# Patient Record
Sex: Male | Born: 1975 | Hispanic: Yes | Marital: Married | Smoking: Never smoker
Health system: Southern US, Community
[De-identification: ages and names within clinical notes are randomized; demographics above are authoritative.]

---

## 2012-03-25 ENCOUNTER — Emergency Department (HOSPITAL_COMMUNITY): Payer: Self-pay

## 2012-03-25 ENCOUNTER — Emergency Department (HOSPITAL_COMMUNITY)
Admission: EM | Admit: 2012-03-25 | Discharge: 2012-03-25 | Disposition: A | Discharge status: Home / Self Care | Payer: Self-pay | Attending: Emergency Medicine | Admitting: Emergency Medicine

## 2012-03-25 ENCOUNTER — Encounter (HOSPITAL_COMMUNITY): Payer: Self-pay | Admitting: Emergency Medicine

## 2012-03-25 DIAGNOSIS — S92403B Displaced unspecified fracture of unspecified great toe, initial encounter for open fracture: Secondary | ICD-10-CM

## 2012-03-25 DIAGNOSIS — Y990 Civilian activity done for income or pay: Secondary | ICD-10-CM | POA: Insufficient documentation

## 2012-03-25 DIAGNOSIS — S97119A Crushing injury of unspecified great toe, initial encounter: Secondary | ICD-10-CM

## 2012-03-25 DIAGNOSIS — S91113A Laceration without foreign body of unspecified great toe without damage to nail, initial encounter: Secondary | ICD-10-CM

## 2012-03-25 DIAGNOSIS — S90129A Contusion of unspecified lesser toe(s) without damage to nail, initial encounter: Secondary | ICD-10-CM | POA: Insufficient documentation

## 2012-03-25 DIAGNOSIS — S91109A Unspecified open wound of unspecified toe(s) without damage to nail, initial encounter: Secondary | ICD-10-CM | POA: Insufficient documentation

## 2012-03-25 DIAGNOSIS — S91209A Unspecified open wound of unspecified toe(s) with damage to nail, initial encounter: Secondary | ICD-10-CM

## 2012-03-25 DIAGNOSIS — M79609 Pain in unspecified limb: Secondary | ICD-10-CM | POA: Insufficient documentation

## 2012-03-25 DIAGNOSIS — W208XXA Other cause of strike by thrown, projected or falling object, initial encounter: Secondary | ICD-10-CM | POA: Insufficient documentation

## 2012-03-25 DIAGNOSIS — R209 Unspecified disturbances of skin sensation: Secondary | ICD-10-CM | POA: Insufficient documentation

## 2012-03-25 MED ORDER — CEFAZOLIN SODIUM 1-5 GM-% IV SOLN
1.0000 g | Freq: Once | INTRAVENOUS | Status: AC
  Administered 2012-03-25: 1 g via INTRAVENOUS
  Filled 2012-03-25: qty 50

## 2012-03-25 MED ORDER — ONDANSETRON HCL 4 MG/2ML IJ SOLN
4.0000 mg | Freq: Once | INTRAMUSCULAR | Status: AC
  Administered 2012-03-25: 4 mg via INTRAVENOUS
  Filled 2012-03-25: qty 2

## 2012-03-25 MED ORDER — HYDROCODONE-ACETAMINOPHEN 5-500 MG PO TABS
1.0000 | ORAL_TABLET | Freq: Four times a day (QID) | ORAL | Status: AC | PRN
Start: 2012-03-25 — End: 2012-04-04

## 2012-03-25 MED ORDER — HYDROMORPHONE HCL PF 1 MG/ML IJ SOLN
1.0000 mg | Freq: Once | INTRAMUSCULAR | Status: AC
  Administered 2012-03-25: 1 mg via INTRAVENOUS
  Filled 2012-03-25: qty 1

## 2012-03-25 MED ORDER — CEPHALEXIN 500 MG PO CAPS: 500.0000 mg | ORAL_CAPSULE | Freq: Four times a day (QID) | ORAL | Status: AC

## 2012-03-25 MED ORDER — SODIUM CHLORIDE 0.9 % IV BOLUS (SEPSIS)
1000.0000 mL | Freq: Once | INTRAVENOUS | Status: AC
  Administered 2012-03-25: 1000 mL via INTRAVENOUS

## 2012-03-25 MED ORDER — TETANUS-DIPHTH-ACELL PERTUSSIS 5-2.5-18.5 LF-MCG/0.5 IM SUSP
0.5000 mL | Freq: Once | INTRAMUSCULAR | Status: AC
  Administered 2012-03-25: 0.5 mL via INTRAMUSCULAR
  Filled 2012-03-25: qty 0.5

## 2012-03-25 NOTE — ED Provider Notes (Signed)
History    This chart was scribed for Suzi Roots, MD, MD by Smitty Pluck. The patient was seen in room TR11C and the patient's care was started at 11:50AM.   CSN: [id number]  Arrival date & time 03/25/12  1128   None     Chief Complaint  Patient presents with  . Foot Injury    (Consider location/radiation/quality/duration/timing/severity/associated sxs/prior treatment) HPI Dustin Gallagher is a 36 y.o. male who presents to the Emergency Department complaining of moderate right great toe injury onset today while at work. Pt denies having UTD tetanus shot. Pt states was at work, heavy object falling onto right great toe. Constant, dull pain worse w movement/palpation. +numbness. Denies other injury. Skin lac to toe.     History reviewed. No pertinent past medical history.  History reviewed. No pertinent past surgical history.  History reviewed. No pertinent family history.  History  Substance Use Topics  . Smoking status: Never Smoker   . Smokeless tobacco: Not on file  . Alcohol Use: Yes      Review of Systems  Constitutional: Negative for fever.  Gastrointestinal: Negative for vomiting.  Skin: Positive for wound.  Neurological: Positive for numbness.    Allergies  Review of patient's allergies indicates no known allergies.  Home Medications   Current Outpatient Rx  Name Route Sig Dispense Refill  . OVER THE COUNTER MEDICATION Nasal Place 1 spray into the nose 2 (two) times daily as needed. For nasal drip      BP 117/76  Pulse 91  Temp 98.3 F (36.8 C) (Oral)  Resp 18  SpO2 97%  Physical Exam  Nursing note and vitals reviewed. Constitutional: He is oriented to person, place, and time. He appears well-developed and well-nourished. No distress.  HENT:  Head: Atraumatic.  Eyes: Conjunctivae are normal.  Neck: No tracheal deviation present.  Cardiovascular: Normal rate.   Pulmonary/Chest: Effort normal. No accessory muscle usage. No respiratory  distress.  Musculoskeletal:       Contusion/crush injury to right great toe. Nail bed disrupted, nail almost completely avulsed/subun hematoma spont draining. +cap refill distally. Open wound/laceration to great toe along medial and lateral aspect. Unable to fully flex/move great toe due to pain and swelling. Dp/pt 2+.   Neurological: He is alert and oriented to person, place, and time.  Skin: Skin is warm and dry.  Psychiatric: He has a normal mood and affect.    ED Course  Procedures (including critical care time) DIAGNOSTIC STUDIES: Oxygen Saturation is 97% on room air, normal by my interpretation.    COORDINATION OF CARE: 12:00PM EDP ordered medication: dilaudid 1 mg, boostrix 0.5 ml, Zofran 4 mg, 0.9% NaCl bolus 1000 ml, ancef 1 g/ 50 ml   Dg Toe Great Right  03/25/2012  *RADIOLOGY REPORT*  Clinical Data: Crush injury with pain and laceration.  RIGHT GREAT TOE  Comparison: None.  Findings: There is a mildly displaced fracture of the first distal phalanx, centered at the midportion.  Fracture line appears to extend proximally to the interphalangeal joint.  Overlying soft tissue injury is seen predominantly along the dorsal surface.  No dislocation.  IMPRESSION: Open first distal phalangeal fracture.  Original Report Authenticated By: Reyes Ivan, M.D.       MDM  I personally performed the services described in this documentation, which was scribed in my presence. The recorded information has been reviewed and considered. No att. providers found  Iv ns. Dilaudid 1 mg iv. zofran iv. Elevate, coldpack. Tetanus  im. Ancef iv.   Xrays. Ancef iv.   LACERATION REPAIR Performed by: Suzi Roots Authorized by: Suzi Roots Consent: Verbal consent obtained. Risks and benefits: risks, benefits and alternatives were discussed Consent given by: patient Patient identity confirmed: provided demographic data Prepped and Draped in normal sterile fashion Wound explored  Laceration  Location: right great toe  Laceration Length: 4 cm  No Foreign Bodies seen or palpated  Anesthesia: digit block  Local anesthetic: lidocaine 2% wo epinephrine  Anesthetic total: 6 ml  Irrigation method: syringe Amount of cleaning: extensive.  Skin closure: layered. Subcut approx w 4-0 vicryl 4 sutures, skin 4-0 prolene 10, loosely approximated skin edges  Number of sutures: 14  Technique: simple interrupted, layered closure.  Patient tolerance: Patient tolerated the procedure well with no immediate complications.   Discussed need for elevation, cold, avoid walking on as risk tearing sutures.  Recheck post suture, good cap refill distally.    Sterile dressing. Cast shoe.    Suzi Roots, MD 03/25/12 [phone]

## 2012-03-25 NOTE — Progress Notes (Signed)
Orthopedic Tech Progress Note Patient Details:  Dustin Gallagher November 03, 1975 [id number]  Ortho Devices Type of Ortho Device: Crutches Ortho Device/Splint Interventions: Application   Ercil Cassis T 03/25/2012, 2:47 PM

## 2012-03-25 NOTE — ED Notes (Signed)
Pt smashed right big toe in machine at work; laceration seen at nail bed; bleeding controlled

## 2012-03-25 NOTE — ED Notes (Signed)
Pt reports having a car lift getting dropped on his foot.  Laceration noted under nail bed.  Bleeding controlled.  Sensation present but weak.  Pt is unable to move toe.

## 2012-03-25 NOTE — Progress Notes (Signed)
Orthopedic Tech Progress Note Patient Details:  Dustin Gallagher 07/15/1976 [id number]  Ortho Devices Type of Ortho Device: Postop boot Ortho Device/Splint Location: right foot Ortho Device/Splint Interventions: Application   Dustin Gallagher 03/25/2012, 2:50 PM

## 2012-07-20 ENCOUNTER — Ambulatory Visit: Payer: Self-pay | Admitting: Family Medicine

## 2012-07-20 VITALS — BP 110/74 | HR 78 | Temp 98.7°F | Resp 20 | Ht 66.0 in | Wt 187.8 lb

## 2012-07-20 DIAGNOSIS — T485X5A Adverse effect of other anti-common-cold drugs, initial encounter: Secondary | ICD-10-CM

## 2012-07-20 DIAGNOSIS — J310 Chronic rhinitis: Secondary | ICD-10-CM

## 2012-07-20 MED ORDER — FLUTICASONE PROPIONATE 50 MCG/ACT NA SUSP: NASAL | Status: DC

## 2012-07-20 MED ORDER — PREDNISONE 20 MG PO TABS: ORAL_TABLET | ORAL | Status: DC

## 2012-07-20 NOTE — Patient Instructions (Signed)
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 This is a rebound problem which causes too much swelling of the tissues in the nose whenever the nose spray as getting out of the system. It is very important to get off of the over-the-counter nose spray completely.  Sudafed OTC can be purchased by asking the pharmacist for it. He needs the type that has to be signed for at the pharmacy counter.  Flonase 2 sprays each nostril twice daily for one week, then once daily  Take the prednisone as directed  Return if not improving over the next week or 2, or if worse at any time.

## 2012-07-20 NOTE — Progress Notes (Signed)
Subjective: Patient has been having nasal congestion for the past 6 months. He has been using oxymetazoline nasal spray about 3 times a day. It helps, but then he is more congested. He works at a [employer]. He does not smoke. The patient does not speaking this, but his friend is interpreting for him.  Objective: TMs normal. Nose has a lot of swelling of the turbinates. Throat clear. Neck supple without significant nodes.  Assessment: Rhinitis medicamentosa  Plan: Discontinue the use of the oxymetazoline. Flonase twice a day for one week, then daily OTC Sudafed Prednisone taper Return if not improving

## 2013-11-09 ENCOUNTER — Ambulatory Visit: Payer: Self-pay | Admitting: Physician Assistant

## 2013-11-09 VITALS — BP 120/82 | HR 67 | Temp 97.7°F | Resp 16 | Ht 67.0 in | Wt 192.0 lb

## 2013-11-09 DIAGNOSIS — J310 Chronic rhinitis: Secondary | ICD-10-CM

## 2013-11-09 DIAGNOSIS — T485X5A Adverse effect of other anti-common-cold drugs, initial encounter: Secondary | ICD-10-CM

## 2013-11-09 DIAGNOSIS — Z9109 Other allergy status, other than to drugs and biological substances: Secondary | ICD-10-CM

## 2013-11-09 DIAGNOSIS — J329 Chronic sinusitis, unspecified: Secondary | ICD-10-CM

## 2013-11-09 MED ORDER — PREDNISONE 10 MG PO TABS: ORAL_TABLET | ORAL | Status: AC

## 2013-11-09 MED ORDER — FLUTICASONE PROPIONATE 50 MCG/ACT NA SUSP: NASAL | Status: AC

## 2013-11-09 MED ORDER — AMOXICILLIN 875 MG PO TABS: 1750.0000 mg | ORAL_TABLET | Freq: Two times a day (BID) | ORAL | Status: AC

## 2013-11-09 MED ORDER — GUAIFENESIN ER 1200 MG PO TB12: 1.0000 | ORAL_TABLET | Freq: Two times a day (BID) | ORAL | Status: AC

## 2013-11-09 NOTE — Patient Instructions (Signed)
NO MORE AFRIN!!! You have a sinus infection as a result of your allergies.  You will be on the Flonase to help with your daily allergies.

## 2013-11-09 NOTE — Progress Notes (Signed)
   Subjective:    Patient ID: Dustin Gallagher, male    DOB: 1976/01/21, 38 y.o.   MRN: [id number]  HPI Pt presents to clinic with significant sinus pressure and congestion.  He has yellow sinus drainage and has for about 2 months.  He has chronic sinus congestion especially at night.  He was given Flonase in the past that helped with his nightly congestion but he ran out of it.  He started using OTC generic afrin about 2 months ago but it does not seem to be helping at all.    OTC med - afrin for about 2 months Works in a dusty environment  Review of Systems  HENT: Positive for congestion and rhinorrhea (yellow). Negative for postnasal drip.   Respiratory: Negative for cough.   Neurological: Negative for dizziness and headaches.       Objective:   Physical Exam  Vitals reviewed. Constitutional: He is oriented to person, place, and time. He appears well-developed and well-nourished.  HENT:  Head: Normocephalic and atraumatic.  Right Ear: Hearing, tympanic membrane, external ear and ear canal normal.  Left Ear: Hearing, tympanic membrane, external ear and ear canal normal.  Nose: Mucosal edema (swollen) present. No rhinorrhea.  Mouth/Throat: Uvula is midline, oropharynx is clear and moist and mucous membranes are normal.  Eyes: Conjunctivae are normal.  Neck: Normal range of motion.  Cardiovascular: Normal rate, regular rhythm and normal heart sounds.   No murmur heard. Pulmonary/Chest: Effort normal and breath sounds normal.  Neurological: He is alert and oriented to person, place, and time.  Skin: Skin is warm and dry.  Psychiatric: He has a normal mood and affect. His behavior is normal. Judgment and thought content normal.       Assessment & Plan:  Environmental allergies - Plan: fluticasone (FLONASE) 50 MCG/ACT nasal spray  Rhinitis medicamentosa - Plan: fluticasone (FLONASE) 50 MCG/ACT nasal spray, predniSONE (DELTASONE) 10 MG tablet  Sinusitis - Plan: amoxicillin  (AMOXIL) 875 MG tablet, Guaifenesin (MUCINEX MAXIMUM STRENGTH) 1200 MG TB12  D/w the patient the importance of not using Afrin for more than 3 days at a time due to rebound congestion.  He has environmental allergies and needs daily treatment with intranasal steroid to help his nasal congestion.    Benny LennertSarah Chandell Attridge PA-C 11/09/2013 9:09 AM

## 2013-11-30 IMAGING — CR DG TOE GREAT 2+V*R*
3 series · 3 of 3 positions shown · non-contrast
Comparison: None.

CLINICAL DATA: Crush injury with pain and laceration.

RIGHT GREAT TOE

[x toes ap left]
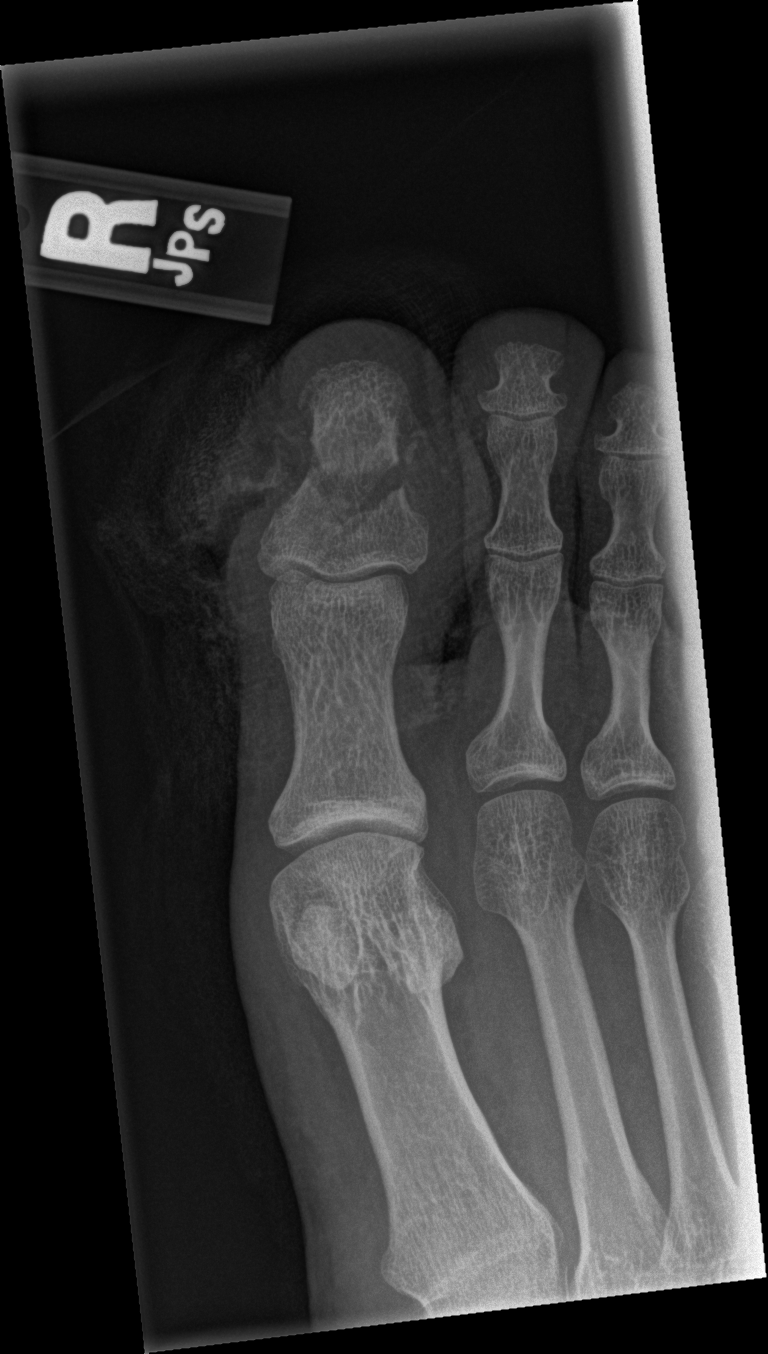

[x toes obl left]
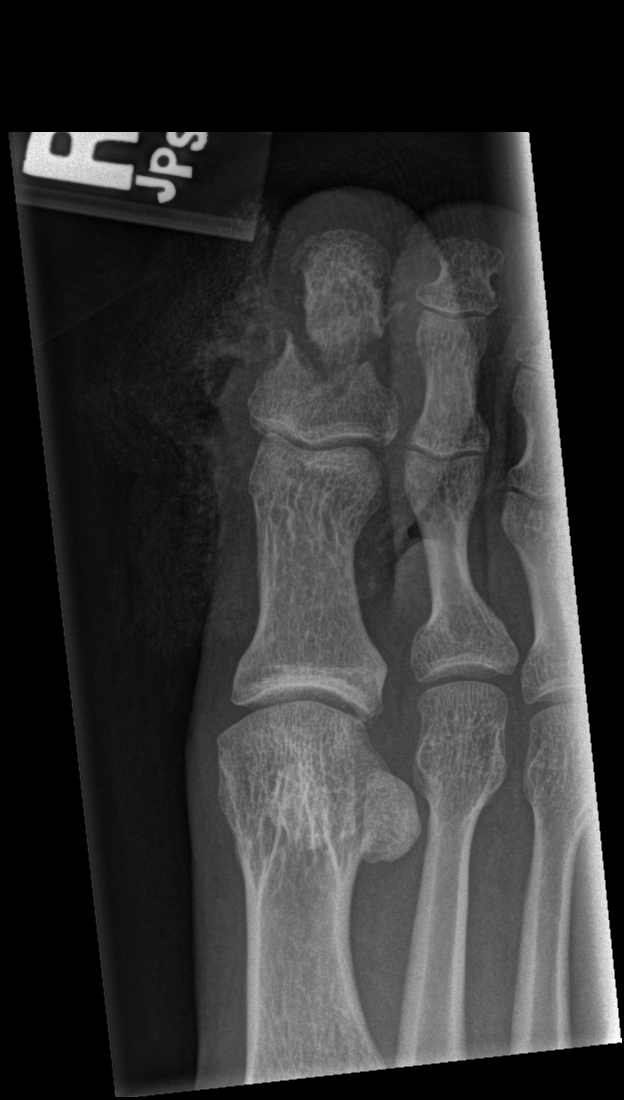

[x toes lat left]
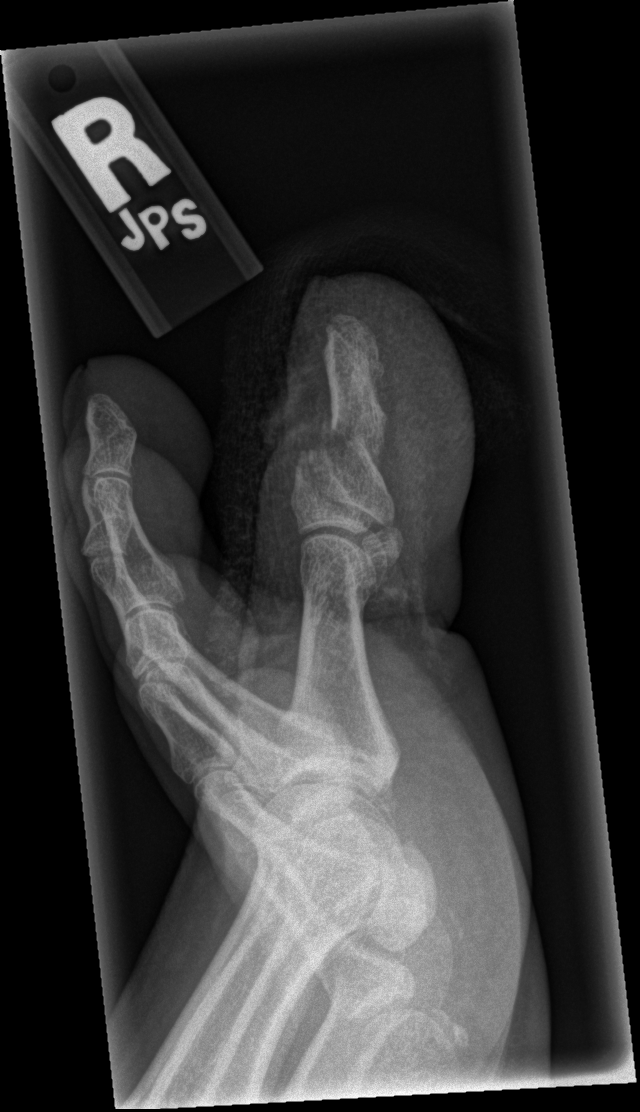

[3 of 3 positions shown; findings below may reference images not displayed]

FINDINGS: There is a mildly displaced fracture of the first distal
phalanx, centered at the midportion.  Fracture line appears to
extend proximally to the interphalangeal joint.  Overlying soft
tissue injury is seen predominantly along the dorsal surface.  No
dislocation.
IMPRESSION: Open first distal phalangeal fracture.

## 2018-03-11 ENCOUNTER — Emergency Department (HOSPITAL_COMMUNITY)
Admission: EM | Admit: 2018-03-11 | Discharge: 2018-03-12 | Disposition: A | Discharge status: Home / Self Care | Payer: BLUE CROSS/BLUE SHIELD | Attending: Emergency Medicine | Admitting: Emergency Medicine

## 2018-03-11 ENCOUNTER — Other Ambulatory Visit: Payer: Self-pay

## 2018-03-11 ENCOUNTER — Encounter (HOSPITAL_COMMUNITY): Payer: Self-pay | Admitting: Emergency Medicine

## 2018-03-11 DIAGNOSIS — Y939 Activity, unspecified: Secondary | ICD-10-CM | POA: Insufficient documentation

## 2018-03-11 DIAGNOSIS — Y929 Unspecified place or not applicable: Secondary | ICD-10-CM | POA: Diagnosis not present

## 2018-03-11 DIAGNOSIS — W272XXA Contact with scissors, initial encounter: Secondary | ICD-10-CM | POA: Diagnosis not present

## 2018-03-11 DIAGNOSIS — Y999 Unspecified external cause status: Secondary | ICD-10-CM | POA: Insufficient documentation

## 2018-03-11 DIAGNOSIS — S71111A Laceration without foreign body, right thigh, initial encounter: Secondary | ICD-10-CM | POA: Insufficient documentation

## 2018-03-11 MED ORDER — LIDOCAINE-EPINEPHRINE (PF) 2 %-1:200000 IJ SOLN
10.0000 mL | Freq: Once | INTRAMUSCULAR | Status: AC
  Administered 2018-03-11: 10 mL
  Filled 2018-03-11: qty 20

## 2018-03-11 MED ORDER — TETANUS-DIPHTH-ACELL PERTUSSIS 5-2.5-18.5 LF-MCG/0.5 IM SUSP: 0.5000 mL | Freq: Once | INTRAMUSCULAR | Status: DC

## 2018-03-11 NOTE — ED Triage Notes (Signed)
Patient to ED c/o continued bleeding to R perineum after trying to lance an abscess on his own. He reports he had one on his arm recently and thought he could just open it up on his own at home. This happened about 45 minutes ago and patient states he's gone through a couple of women's pads since. Pad soaked with blood and site still bleeding - ABD pad and pressure applied to site.

## 2018-03-11 NOTE — ED Notes (Signed)
ED Provider at bedside. 

## 2018-03-11 NOTE — ED Notes (Signed)
Pt reports cutting a wart on his arm with scissors and it was fine. Pt attempted the same with a wart on the interior portion of his right thigh. Bleeding present.

## 2018-03-12 NOTE — Discharge Instructions (Addendum)
You were seen in the emergency department for laceration to your thigh.  This was repaired with 2 sutures.  Keep the area clean throughout the day with clean water and soap.  You need sutures to be removed within 7 to 10 days.  Monitor for signs of infection including increased pain, swelling, redness, warmth, pus, fevers.  Vino al departamento de emergencia para evaluacion de Clinical research associateuna laceracin en el muslo.  Esto herida fue reparada con 2 suturas.  Mantenga el rea limpia durante todo el da con agua limpia y [place].  La suturas deben de ser retiradas en 7 a 10 das.  Monitoree los signos de infeccin, incluyendo aumento del dolor, hinchazn, enrojecimiento, calor, pus, fiebres.

## 2018-03-12 NOTE — ED Provider Notes (Signed)
[facility] EMERGENCY DEPARTMENT Provider Note   CSN: [id number] Arrival date & time: 03/11/18  2212     History   Chief Complaint Chief Complaint  Patient presents with  . Laceration    HPI Dustin Gallagher is a 42 y.o. male here for evaluation of wound to right inner groin onset 1 hr PTA. States he used scissors to clip a skin tag from this area and it has not stopped bleeding. States he has had this skin tag for several years.  It has become irritated from continued sweating during the summer. States he has clipped other skin tags in his arm and had no problems. Blood has leaked through a couple of women's pads since. Tdap updated in 2013.   HPI  History reviewed. No pertinent past medical history.  Patient Active Problem List   Diagnosis Date Noted  . Environmental allergies 11/09/2013    History reviewed. No pertinent surgical history.      Home Medications    Prior to Admission medications   Medication Sig Start Date End Date Taking? Authorizing Provider  amoxicillin (AMOXIL) 875 MG tablet Take 2 tablets (1,750 mg total) by mouth 2 (two) times daily. Patient not taking: Reported on 03/11/2018 11/09/13   Morrell Riddle, PA-C  fluticasone [facility]) 50 MCG/ACT nasal spray Use 2 sprays each nostril twice daily for one week, then decrease to using 2 sprays each nostril once daily Patient not taking: Reported on 03/11/2018 11/09/13   Morrell Riddle, PA-C    Family History Family History  Problem Relation Age of Onset  . Cancer Mother   . Diabetes Father   . Diabetes Maternal Grandmother   . Hypertension Maternal Grandmother   . Diabetes Maternal Grandfather     Social History Social History   Tobacco Use  . Smoking status: Never Smoker  . Smokeless tobacco: Never Used  Substance Use Topics  . Alcohol use: Yes  . Drug use: No     Allergies   Patient has no known allergies.   Review of Systems Review of Systems  Skin: Positive for wound.    All other systems reviewed and are negative.    Physical Exam Updated Vital Signs BP 122/82   Pulse 66   Temp 97.9 F (36.6 C) (Oral)   Resp 16   SpO2 98%   Physical Exam  Constitutional: He is oriented to person, place, and time. He appears well-developed and well-nourished.  Non-toxic appearance.  HENT:  Head: Normocephalic.  Right Ear: External ear normal.  Left Ear: External ear normal.  Nose: Nose normal.  Eyes: Conjunctivae and EOM are normal.  Neck: Full passive range of motion without pain.  Cardiovascular: Normal rate.  Pulmonary/Chest: Effort normal. No tachypnea. No respiratory distress.  Musculoskeletal: Normal range of motion.  Neurological: He is alert and oriented to person, place, and time.  Skin: Skin is warm and dry. Capillary refill takes less than 2 seconds.  1 x 1 cm circular wound to right inner thigh, oozing dark blood moderately. Minimal tenderness. No surrounding erythema, edema, warmth, discharge.   Psychiatric: His behavior is normal. Thought content normal.     ED Treatments / Results  Labs (all labs ordered are listed, but only abnormal results are displayed) Labs Reviewed - No data to display  EKG None  Radiology No results found.  Procedures .Marland KitchenLaceration Repair Date/Time: 03/12/2018 12:45 AM Performed by: Liberty Handy, PA-C Authorized by: Liberty Handy, PA-C   Consent:  Consent obtained:  Verbal   Consent given by:  Patient   Risks discussed:  Infection and pain   Alternatives discussed: alternative treatment including dressing and observation. Anesthesia (see MAR for exact dosages):    Anesthesia method:  Local infiltration   Local anesthetic:  Lidocaine 2% WITH epi Laceration details:    Location:  Leg   Leg location:  R upper leg   Wound length (cm): 1x1 cm. Repair type:    Repair type:  Simple Pre-procedure details:    Preparation:  Patient was prepped and draped in usual sterile fashion Exploration:     Hemostasis achieved with:  Epinephrine   Contaminated: no   Treatment:    Wound cleansed with: chlorhexedine.   Amount of cleaning:  Standard   Irrigation solution:  Sterile saline   Irrigation volume:  20 cc   Irrigation method:  Syringe   Visualized foreign bodies/material removed: no   Skin repair:    Repair method:  Sutures   Suture size:  5-0   Wound skin closure material used: ethilon.   Suture technique:  Simple interrupted   Number of sutures:  2 Approximation:    Approximation:  Close Post-procedure details:    Dressing:  Non-adherent dressing and bulky dressing   Patient tolerance of procedure:  Tolerated well, no immediate complications   (including critical care time)  Medications Ordered in ED Medications  lidocaine-EPINEPHrine (XYLOCAINE W/EPI) 2 %-1:200000 (PF) injection 10 mL (10 mLs Infiltration Given by Other 03/11/18 2312)     Initial Impression / Assessment and Plan / ED Course  I have reviewed the triage vital signs and the nursing notes.  Pertinent labs & imaging results that were available during my care of the patient were reviewed by me and considered in my medical decision making (see chart for details).    Patient is a 42 y.o. yo male that presents with laceration to right medial upper thigh. Tdap booster up to date. Pressure irrigation performed. Bottom of the wound visualized with bleeding controled, no foreign bodies seen.  No tendon or nerve injury noted.  Repaired with 2 sutures which controlled bleeding. Pt has no co morbidities to effect normal wound healing. Discussed suture home care with pt and answered questions. Pt to follow up for wound check and suture removal in 7-10 days. Return instructions discussed.      Final Clinical Impressions(s) / ED Diagnoses   Final diagnoses:  Laceration of right thigh, initial encounter    ED Discharge Orders    None       Jerrell MylarGibbons, Srinidhi Landers J, PA-C 03/12/18 1209    Arby BarrettePfeiffer, Marcy, MD 03/13/18  1455

## 2018-03-12 NOTE — ED Notes (Signed)
Spanish interpreter used for discharged. Signature pad unavailable at time of pt discharge. Pt verbalized understanding.
